# Patient Record
Sex: Male | Born: 1995 | Race: Black or African American | Hispanic: No | Marital: Single | Smoking: Current every day smoker
Health system: Southern US, Community
[De-identification: ages and names within clinical notes are randomized; demographics above are authoritative.]

---
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
## 2017-05-11 ENCOUNTER — Ambulatory Visit (HOSPITAL_COMMUNITY)
Admission: EM | Admit: 2017-05-11 | Discharge: 2017-05-11 | Disposition: A | Discharge status: Home / Self Care | Payer: Self-pay | Attending: Family Medicine | Admitting: Family Medicine

## 2017-05-11 ENCOUNTER — Encounter (HOSPITAL_COMMUNITY): Payer: Self-pay | Admitting: *Deleted

## 2017-05-11 ENCOUNTER — Other Ambulatory Visit: Payer: Self-pay

## 2017-05-11 DIAGNOSIS — J069 Acute upper respiratory infection, unspecified: Secondary | ICD-10-CM

## 2017-05-11 DIAGNOSIS — B9789 Other viral agents as the cause of diseases classified elsewhere: Secondary | ICD-10-CM

## 2017-05-11 DIAGNOSIS — R110 Nausea: Secondary | ICD-10-CM

## 2017-05-11 MED ORDER — ONDANSETRON HCL 4 MG PO TABS: 4.0000 mg | ORAL_TABLET | Freq: Four times a day (QID) | ORAL | 0 refills | Status: DC

## 2017-05-11 NOTE — Discharge Instructions (Signed)
You have a viral infection, could be the flu. Treat with Delsym as needed for cough. This is over the counter. May take Motrin 800mg  (OTC) every 8 hours for fever and chills. Use the Zofran if needed for nausea. Push fluids. FU if worsens.

## 2017-05-11 NOTE — ED Provider Notes (Signed)
[facility]    CSN: [id number] Arrival date & time: 05/11/17  1600     History   Chief Complaint Chief Complaint  Patient presents with  . Cough  . Nausea    HPI Clinton Rhodes is a 22 y.o. male.   Who presents with a 2-3 day history of dry cough, fever, chills, myalgias, and nausea. No known exposures. Subjective fever is noted.       History reviewed. No pertinent past medical history.  There are no active problems to display for this patient.   History reviewed. No pertinent surgical history.     Home Medications    Prior to Admission medications   Medication Sig Start Date End Date Taking? Authorizing Provider  ondansetron (ZOFRAN) 4 MG tablet Take 1 tablet (4 mg total) by mouth every 6 (six) hours. 05/11/17   Riki SheerYoung, Lajeana Strough G, PA-C    Family History History reviewed. No pertinent family history.  Social History Social History   Tobacco Use  . Smoking status: Current Every Day Smoker  . Smokeless tobacco: Never Used  Substance Use Topics  . Alcohol use: Yes    Comment: occasionally  . Drug use: Yes    Types: Marijuana     Allergies   Shellfish allergy   Review of Systems Review of Systems  Constitutional: Positive for chills, fatigue and fever.  HENT: Negative.   Respiratory: Positive for cough. Negative for shortness of breath and wheezing.   Musculoskeletal: Positive for arthralgias.  Skin: Negative.      Physical Exam Triage Vital Signs ED Triage Vitals  Enc Vitals Group     BP 05/11/17 1615 139/66     Pulse Rate 05/11/17 1615 88     Resp 05/11/17 1615 16     Temp 05/11/17 1615 (!) 97.5 F (36.4 C)     Temp Source 05/11/17 1615 Oral     SpO2 05/11/17 1615 98 %     Weight --      Height --      Head Circumference --      Peak Flow --      Pain Score 05/11/17 1617 0     Pain Loc --      Pain Edu? --      Excl. in GC? --    No data found.  Updated Vital Signs BP 139/66   Pulse 88   Temp (!) 97.5 F  (36.4 C) (Oral)   Resp 16   SpO2 98%   Visual Acuity Right Eye Distance:   Left Eye Distance:   Bilateral Distance:    Right Eye Near:   Left Eye Near:    Bilateral Near:     Physical Exam  Constitutional: He is oriented to person, place, and time. He appears well-developed and well-nourished. No distress.  HENT:  Head: Normocephalic and atraumatic.  Right Ear: External ear normal.  Left Ear: External ear normal.  Nose: Nose normal.  Mouth/Throat: Oropharynx is clear and moist.  Neck: Normal range of motion.  Cardiovascular: Normal rate and regular rhythm.  Pulmonary/Chest: Effort normal. He has wheezes.  Mild expiratory wheeze lower bases  Abdominal: Soft. Bowel sounds are normal. He exhibits no distension. There is no tenderness. There is no guarding.  Lymphadenopathy:    He has no cervical adenopathy.  Neurological: He is alert and oriented to person, place, and time.  Skin: Skin is warm and dry. He is not diaphoretic.  Psychiatric: His behavior is normal.  Nursing note and vitals reviewed.    UC Treatments / Results  Labs (all labs ordered are listed, but only abnormal results are displayed) Labs Reviewed - No data to display  EKG  EKG Interpretation None       Radiology No results found.  Procedures Procedures (including critical care time)  Medications Ordered in UC Medications - No data to display   Initial Impression / Assessment and Plan / UC Course  I have reviewed the triage vital signs and the nursing notes.  Pertinent labs & imaging results that were available during my care of the patient were reviewed by me and considered in my medical decision making (see chart for details).     Viral illness, treat symptomatically, provide work note. FU as needed.   Final Clinical Impressions(s) / UC Diagnoses   Final diagnoses:  Viral URI with cough  Nausea    ED Discharge Orders        Ordered    ondansetron (ZOFRAN) 4 MG tablet  Every 6  hours     05/11/17 1626       Controlled Substance Prescriptions [place] Controlled Substance Registry consulted? Not Applicable   Sharin Mons 05/11/17 1630

## 2017-05-11 NOTE — ED Triage Notes (Signed)
C/O cough, SOB, nausea & vomiting (able to keep down PO fluids), chills x 2 days.  C/O tactile fever.  Has been taking Alka Seltzer.

## 2018-01-18 ENCOUNTER — Encounter (HOSPITAL_COMMUNITY): Payer: Self-pay | Admitting: Emergency Medicine

## 2018-01-18 ENCOUNTER — Emergency Department (HOSPITAL_COMMUNITY)
Admission: EM | Admit: 2018-01-18 | Discharge: 2018-01-18 | Disposition: A | Discharge status: Home / Self Care | Payer: Self-pay | Attending: Emergency Medicine | Admitting: Emergency Medicine

## 2018-01-18 ENCOUNTER — Other Ambulatory Visit: Payer: Self-pay

## 2018-01-18 DIAGNOSIS — F17210 Nicotine dependence, cigarettes, uncomplicated: Secondary | ICD-10-CM | POA: Insufficient documentation

## 2018-01-18 DIAGNOSIS — Z79899 Other long term (current) drug therapy: Secondary | ICD-10-CM | POA: Insufficient documentation

## 2018-01-18 DIAGNOSIS — R1110 Vomiting, unspecified: Secondary | ICD-10-CM | POA: Insufficient documentation

## 2018-01-18 DIAGNOSIS — R112 Nausea with vomiting, unspecified: Secondary | ICD-10-CM

## 2018-01-18 DIAGNOSIS — R197 Diarrhea, unspecified: Secondary | ICD-10-CM | POA: Insufficient documentation

## 2018-01-18 LAB — COMPREHENSIVE METABOLIC PANEL
ALT: 21 U/L (ref 0–44)
AST: 34 U/L (ref 15–41)
Albumin: 4.1 g/dL (ref 3.5–5.0)
Alkaline Phosphatase: 35 U/L — ABNORMAL LOW (ref 38–126)
Anion gap: 10 (ref 5–15)
BUN: <5 mg/dL — ABNORMAL LOW (ref 6–20)
CO2: 23 mmol/L (ref 22–32)
Calcium: 9.4 mg/dL (ref 8.9–10.3)
Chloride: 104 mmol/L (ref 98–111)
Creatinine, Ser: 0.79 mg/dL (ref 0.61–1.24)
GFR calc Af Amer: >60 mL/min (ref >60)
GFR calc non Af Amer: >60 mL/min (ref >60)
Glucose, Bld: 109 mg/dL — ABNORMAL HIGH (ref 70–99)
Potassium: 4 mmol/L (ref 3.5–5.1)
Sodium: 137 mmol/L (ref 135–145)
Total Bilirubin: 1.2 mg/dL (ref 0.3–1.2)
Total Protein: 7.7 g/dL (ref 6.5–8.1)

## 2018-01-18 LAB — CBC
HCT: 43.8 % (ref 39.0–52.0)
Hemoglobin: 13.5 g/dL (ref 13.0–17.0)
MCH: 23 pg — ABNORMAL LOW (ref 26.0–34.0)
MCHC: 30.8 g/dL (ref 30.0–36.0)
MCV: 74.6 fL — ABNORMAL LOW (ref 80.0–100.0)
Platelets: 225 10*3/uL (ref 150–400)
RBC: 5.87 MIL/uL — ABNORMAL HIGH (ref 4.22–5.81)
RDW: 15.5 % (ref 11.5–15.5)
WBC: 5.9 10*3/uL (ref 4.0–10.5)
nRBC: 0 % (ref 0.0–0.2)

## 2018-01-18 LAB — LIPASE, BLOOD: Lipase: 22 U/L (ref 11–51)

## 2018-01-18 MED ORDER — ONDANSETRON HCL 4 MG/2ML IJ SOLN
4.0000 mg | Freq: Once | INTRAMUSCULAR | Status: AC
  Administered 2018-01-18: 4 mg via INTRAVENOUS
  Filled 2018-01-18: qty 2

## 2018-01-18 MED ORDER — ONDANSETRON HCL 8 MG PO TABS: 8.0000 mg | ORAL_TABLET | Freq: Three times a day (TID) | ORAL | 0 refills | Status: AC | PRN

## 2018-01-18 MED ORDER — SODIUM CHLORIDE 0.9 % IV BOLUS
1000.0000 mL | Freq: Once | INTRAVENOUS | Status: AC
  Administered 2018-01-18: 1000 mL via INTRAVENOUS

## 2018-01-18 NOTE — ED Notes (Signed)
Patient verbalizes understanding of discharge instructions. Opportunity for questioning and answers were provided. Ambulatory at discharge in NAD.  

## 2018-01-18 NOTE — Discharge Instructions (Addendum)
Start with a clear liquid diet then gradually advance to regular foods after a day or 2.  Use Tylenol or Motrin for pain or fever.  Return here, if needed, for problems.  We sent a prescription for a nausea pill to your drugstore.

## 2018-01-18 NOTE — ED Provider Notes (Signed)
[facility] EMERGENCY DEPARTMENT Provider Note   CSN: [id number] Arrival date & time: 01/18/18  1251     History   Chief Complaint Chief Complaint  Patient presents with  . Headache  . Nausea  . Chills    HPI Clinton Rhodes is a 22 y.o. male.  HPI   Patient presents for evaluation illness for several days associated with general achiness, nausea, vomiting, and diarrhea.  He has had chills without documented fever.  He denies weakness or dizziness.  Has not been any blood in the emesis or stool.  Several coworkers have similar illnesses.  He does not take chronic medications.  There are no other known modifying factors.  History reviewed. No pertinent past medical history.  There are no active problems to display for this patient.   History reviewed. No pertinent surgical history.      Home Medications    Prior to Admission medications   Medication Sig Start Date End Date Taking? Authorizing Provider  acetaminophen (TYLENOL) 500 MG tablet Take 500 mg by mouth every 6 (six) hours as needed for headache.    Yes [provider]  ibuprofen (ADVIL,MOTRIN) 200 MG tablet Take 200 mg by mouth every 6 (six) hours as needed for headache or moderate pain.    Yes [provider]  ondansetron (ZOFRAN) 8 MG tablet Take 1 tablet (8 mg total) by mouth every 8 (eight) hours as needed for nausea or vomiting. 01/18/18   Mancel Bale, MD    Family History No family history on file.  Social History Social History   Tobacco Use  . Smoking status: Current Every Day Smoker  . Smokeless tobacco: Never Used  Substance Use Topics  . Alcohol use: Yes    Comment: occasionally  . Drug use: Yes    Types: Marijuana     Allergies   Shellfish allergy   Review of Systems Review of Systems  All other systems reviewed and are negative.    Physical Exam Updated Vital Signs BP (!) 156/87   Pulse (!) 58   Temp 100 F (37.8 C) (Oral)    Resp 17   Ht 5\' 10"  (1.778 m)   Wt 83.9 kg   SpO2 96%   BMI 26.54 kg/m   Physical Exam  Constitutional: He is oriented to person, place, and time. He appears well-developed and well-nourished.  HENT:  Head: Normocephalic and atraumatic.  Right Ear: External ear normal.  Left Ear: External ear normal.  Eyes: Pupils are equal, round, and reactive to light. Conjunctivae and EOM are normal.  Neck: Normal range of motion and phonation normal. Neck supple.  Cardiovascular: Normal rate, regular rhythm and normal heart sounds.  Pulmonary/Chest: Effort normal and breath sounds normal. No respiratory distress. He exhibits no bony tenderness.  Abdominal: Soft. There is no tenderness.  Musculoskeletal: Normal range of motion.  Neurological: He is alert and oriented to person, place, and time. No cranial nerve deficit or sensory deficit. He exhibits normal muscle tone. Coordination normal.  Skin: Skin is warm, dry and intact.  Psychiatric: He has a normal mood and affect. His behavior is normal. Judgment and thought content normal.  Nursing note and vitals reviewed.    ED Treatments / Results  Labs (all labs ordered are listed, but only abnormal results are displayed) Labs Reviewed  COMPREHENSIVE METABOLIC PANEL - Abnormal; Notable for the following components:      Result Value   Glucose, Bld 109 (*)  BUN <5 (*)    Alkaline Phosphatase 35 (*)    All other components within normal limits  CBC - Abnormal; Notable for the following components:   RBC 5.87 (*)    MCV 74.6 (*)    MCH 23.0 (*)    All other components within normal limits  LIPASE, BLOOD  URINALYSIS, ROUTINE W REFLEX MICROSCOPIC    EKG None  Radiology No results found.  Procedures Procedures (including critical care time)  Medications Ordered in ED Medications  sodium chloride 0.9 % bolus 1,000 mL (0 mLs Intravenous Stopped 01/18/18 1506)  ondansetron (ZOFRAN) injection 4 mg (4 mg Intravenous Given 01/18/18  1407)     Initial Impression / Assessment and Plan / ED Course  I have reviewed the triage vital signs and the nursing notes.  Pertinent labs & imaging results that were available during my care of the patient were reviewed by me and considered in my medical decision making (see chart for details).  Clinical Course as of Jan 19 1544  Sun Jan 18, 2018  1544 Normal except mild elevation of glucose.  Comprehensive metabolic panel(!) [EW]  1545 Normal  Lipase, blood [EW]  1545 Normal except MCV low  CBC(!) [EW]    Clinical Course User Index [EW] Mancel Bale, MD     Patient Vitals for the past 24 hrs:  BP Temp Temp src Pulse Resp SpO2 Height Weight  01/18/18 1500 (!) 156/87 - - (!) 58 - 96 % - -  01/18/18 1430 (!) 143/92 - - 76 - 97 % - -  01/18/18 1400 (!) 148/98 - - 67 - 99 % - -  01/18/18 1300 (!) 152/88 100 F (37.8 C) Oral 62 17 100 % 5\' 10"  (1.778 m) 83.9 kg    3:43 PM Reevaluation with update and discussion. After initial assessment and treatment, an updated evaluation reveals he is comfortable has no further complaints.  He has completed a liter of IV fluids.  Findings discussed and questions answered. Mancel Bale   Medical Decision Making: Nausea vomiting diarrhea likely viral, possibly food poisoning.  Patient is nontoxic.  Evaluation here is showing.  No indication for further treatment here or hospitalization at this time.  CRITICAL CARE-no Performed by: Mancel Bale  Nursing Notes Reviewed/ Care Coordinated Applicable Imaging Reviewed Interpretation of Laboratory Data incorporated into ED treatment  The patient appears reasonably screened and/or stabilized for discharge and I doubt any other medical condition or other [facility] requiring further screening, evaluation, or treatment in the ED at this time prior to discharge.  Plan: Home Medications-OTC antipyretic as needed; Home Treatments-gradually advance diet; return here if the recommended treatment, does not  improve the symptoms; Recommended follow up-PCP, PRN   Final Clinical Impressions(s) / ED Diagnoses   Final diagnoses:  Nausea vomiting and diarrhea    ED Discharge Orders         Ordered    ondansetron (ZOFRAN) 8 MG tablet  Every 8 hours PRN     01/18/18 1542           Mancel Bale, MD 01/18/18 1545

## 2018-01-18 NOTE — ED Triage Notes (Signed)
Pt. Stated, Clinton Rhodes had stomach pain on Friday and couldn't keep nothing down, sometimes I can keep some  liquids down. Ive also been shaking all over with chills. This all stated on Friday.

## 2020-03-06 ENCOUNTER — Other Ambulatory Visit: Payer: Self-pay

## 2020-03-06 ENCOUNTER — Ambulatory Visit (HOSPITAL_COMMUNITY)
Admission: EM | Admit: 2020-03-06 | Discharge: 2020-03-06 | Disposition: A | Discharge status: Home / Self Care | Payer: Self-pay

## 2020-03-06 ENCOUNTER — Encounter (HOSPITAL_COMMUNITY): Payer: Self-pay | Admitting: *Deleted

## 2020-03-06 DIAGNOSIS — R197 Diarrhea, unspecified: Secondary | ICD-10-CM

## 2020-03-06 NOTE — ED Triage Notes (Signed)
Pt presents today for a work note. Pt reports he was not seen by Provider for the stomach bug on SAT

## 2020-03-06 NOTE — ED Provider Notes (Signed)
[facility]    CSN: [id number] Arrival date & time: 03/06/20  1602      History   Chief Complaint Chief Complaint  Patient presents with  . Letter for School/Work  . Abdominal Pain  . Diarrhea    HPI Clinton Rhodes is a 24 y.o. male.   Pt complains of diarrhea and abdominal discomfort that started two days ago.  Denies fever, chills, bloody stools, nausea, vomiting.  He reports some acid reflux as well.  He has taken nothing for the sx.  Denies sick contacts.  Requests a work note for today.      History reviewed. No pertinent past medical history.  There are no problems to display for this patient.   History reviewed. No pertinent surgical history.     Home Medications    Prior to Admission medications   Medication Sig Start Date End Date Taking? Authorizing Provider  ibuprofen (ADVIL,MOTRIN) 200 MG tablet Take 200 mg by mouth every 6 (six) hours as needed for headache or moderate pain.    Yes [provider]  acetaminophen (TYLENOL) 500 MG tablet Take 500 mg by mouth every 6 (six) hours as needed for headache.     [provider]  ondansetron (ZOFRAN) 8 MG tablet Take 1 tablet (8 mg total) by mouth every 8 (eight) hours as needed for nausea or vomiting. 01/18/18   Mancel Bale, MD    Family History History reviewed. No pertinent family history.  Social History Social History   Tobacco Use  . Smoking status: Current Every Day Smoker  . Smokeless tobacco: Never Used  Vaping Use  . Vaping Use: Never used  Substance Use Topics  . Alcohol use: Yes    Comment: occasionally  . Drug use: Yes    Types: Marijuana     Allergies   Shellfish allergy   Review of Systems Review of Systems  Constitutional: Negative for chills and fever.  HENT: Negative for ear pain and sore throat.   Eyes: Negative for pain and visual disturbance.  Respiratory: Negative for cough and shortness of breath.   Cardiovascular: Negative for  chest pain and palpitations.  Gastrointestinal: Positive for abdominal pain and diarrhea. Negative for blood in stool, nausea and vomiting.  Genitourinary: Negative for dysuria and hematuria.  Musculoskeletal: Negative for arthralgias and back pain.  Skin: Negative for color change and rash.  Neurological: Negative for seizures and syncope.  All other systems reviewed and are negative.    Physical Exam Triage Vital Signs ED Triage Vitals  Enc Vitals Group     BP 03/06/20 1717 140/88     Pulse Rate 03/06/20 1717 (!) 59     Resp 03/06/20 1717 16     Temp 03/06/20 1717 98 F (36.7 C)     Temp Source 03/06/20 1717 Oral     SpO2 03/06/20 1717 100 %     Weight --      Height 03/06/20 1720 5\' 9"  (1.753 m)     Head Circumference --      Peak Flow --      Pain Score 03/06/20 1719 4     Pain Loc --      Pain Edu? --      Excl. in GC? --    No data found.  Updated Vital Signs BP 140/88 (BP Location: Right Arm)   Pulse (!) 59   Temp 98 F (36.7 C) (Oral)   Resp 16   Ht 5\' 9"  (  1.753 m)   SpO2 100%   BMI 27.32 kg/m   Visual Acuity Right Eye Distance:   Left Eye Distance:   Bilateral Distance:    Right Eye Near:   Left Eye Near:    Bilateral Near:     Physical Exam Vitals and nursing note reviewed.  Constitutional:      Appearance: He is well-developed.  HENT:     Head: Normocephalic and atraumatic.  Eyes:     Conjunctiva/sclera: Conjunctivae normal.  Cardiovascular:     Rate and Rhythm: Normal rate and regular rhythm.     Heart sounds: No murmur heard.   Pulmonary:     Effort: Pulmonary effort is normal. No respiratory distress.     Breath sounds: Normal breath sounds.  Abdominal:     General: Bowel sounds are normal.     Palpations: Abdomen is soft.     Tenderness: There is no abdominal tenderness. There is no guarding or rebound. Negative signs include McBurney's sign.  Musculoskeletal:     Cervical back: Neck supple.  Skin:    General: Skin is warm and  dry.  Neurological:     Mental Status: He is alert.      UC Treatments / Results  Labs (all labs ordered are listed, but only abnormal results are displayed) Labs Reviewed - No data to display  EKG   Radiology No results found.  Procedures Procedures (including critical care time)  Medications Ordered in UC Medications - No data to display  Initial Impression / Assessment and Plan / UC Course  I have reviewed the triage vital signs and the nursing notes.  Pertinent labs & imaging results that were available during my care of the patient were reviewed by me and considered in my medical decision making (see chart for details).     Vitals WNL, benign abdominal exam.  Recommend imodium and high fiber diet.  Return precautions discussed. Work note given for today.  Final Clinical Impressions(s) / UC Diagnoses   Final diagnoses:  None   Discharge Instructions   None    ED Prescriptions    None     PDMP not reviewed this encounter.   Jodell Cipro, PA-C 03/06/20 1733

## 2020-03-06 NOTE — ED Triage Notes (Signed)
Pt reported he has had diarrhea all day today . ABD pain started on sat.

## 2020-03-06 NOTE — Discharge Instructions (Signed)
Recommend over the counter Imodium May take Prilosec for acid reflux.

## 2020-03-10 ENCOUNTER — Ambulatory Visit (HOSPITAL_COMMUNITY)
Admission: EM | Admit: 2020-03-10 | Discharge: 2020-03-10 | Disposition: A | Discharge status: Home / Self Care | Payer: Self-pay | Attending: Emergency Medicine | Admitting: Emergency Medicine

## 2020-03-10 ENCOUNTER — Other Ambulatory Visit: Payer: Self-pay

## 2020-03-10 ENCOUNTER — Encounter (HOSPITAL_COMMUNITY): Payer: Self-pay | Admitting: Emergency Medicine

## 2020-03-10 DIAGNOSIS — Z7689 Persons encountering health services in other specified circumstances: Secondary | ICD-10-CM

## 2020-03-10 DIAGNOSIS — R109 Unspecified abdominal pain: Secondary | ICD-10-CM

## 2020-03-10 MED ORDER — DICYCLOMINE HCL 20 MG PO TABS: 20.0000 mg | ORAL_TABLET | Freq: Two times a day (BID) | ORAL | 0 refills | Status: AC

## 2020-03-10 NOTE — Discharge Instructions (Signed)
Take Bentyl as needed for abdominal spasms

## 2020-03-10 NOTE — ED Triage Notes (Signed)
Pt presents with abdominal pain that comes and goes. States is no longer having diarrhea but is having constipation. States gets bad cramps when eating solid foods.

## 2020-03-10 NOTE — ED Provider Notes (Signed)
____________________________________________  Time seen: Approximately 4:01 PM  I have reviewed the triage vital signs and the nursing notes.   HISTORY  Chief Complaint Abdominal Pain   Historian Patient    HPI Clinton Rhodes is a 24 y.o. male presents to the urgent care for a return to work evaluation.  Patient stated that he had some diarrhea earlier in the week which is since resolved.  Patient states that he still has some discomfort when he eats solid foods.  He denies emesis or current nausea.  No chest pain or chest tightness.  Patient is requesting a work note.   History reviewed. No pertinent past medical history.   Immunizations up to date:  Yes.     History reviewed. No pertinent past medical history.  There are no problems to display for this patient.   History reviewed. No pertinent surgical history.  Prior to Admission medications   Medication Sig Start Date End Date Taking? Authorizing Provider  acetaminophen (TYLENOL) 500 MG tablet Take 500 mg by mouth every 6 (six) hours as needed for headache.     [provider]  dicyclomine (BENTYL) 20 MG tablet Take 1 tablet (20 mg total) by mouth 2 (two) times daily. 03/10/20   Orvil Feil, PA-C  ibuprofen (ADVIL,MOTRIN) 200 MG tablet Take 200 mg by mouth every 6 (six) hours as needed for headache or moderate pain.     [provider]  ondansetron (ZOFRAN) 8 MG tablet Take 1 tablet (8 mg total) by mouth every 8 (eight) hours as needed for nausea or vomiting. 01/18/18   Mancel Bale, MD    Allergies Shellfish allergy  History reviewed. No pertinent family history.  Social History Social History   Tobacco Use  . Smoking status: Current Every Day Smoker  . Smokeless tobacco: Never Used  Vaping Use  . Vaping Use: Never used  Substance Use Topics  . Alcohol use: Yes    Comment: occasionally  . Drug use: Yes    Types: Marijuana     Review of Systems  Constitutional: No  fever/chills Eyes:  No discharge ENT: No upper respiratory complaints. Respiratory: no cough. No SOB/ use of accessory muscles to breath Gastrointestinal: Patient has abdominal spasms. Musculoskeletal: Negative for musculoskeletal pain. Skin: Negative for rash, abrasions, lacerations, ecchymosis.    ____________________________________________   PHYSICAL EXAM:  VITAL SIGNS: ED Triage Vitals  Enc Vitals Group     BP 03/10/20 1523 137/88     Pulse Rate 03/10/20 1523 62     Resp 03/10/20 1523 16     Temp 03/10/20 1523 98.3 F (36.8 C)     Temp Source 03/10/20 1523 Oral     SpO2 03/10/20 1523 96 %     Weight --      Height --      Head Circumference --      Peak Flow --      Pain Score 03/10/20 1522 6     Pain Loc --      Pain Edu? --      Excl. in GC? --      Constitutional: Alert and oriented. Well appearing and in no acute distress. Eyes: Conjunctivae are normal. PERRL. EOMI. Head: Atraumatic. Cardiovascular: Normal rate, regular rhythm. Normal S1 and S2.  Good peripheral circulation. Respiratory: Normal respiratory effort without tachypnea or retractions. Lungs CTAB. Good air entry to the bases with no decreased or absent breath sounds Gastrointestinal: Bowel sounds x 4 quadrants. Soft and nontender to  palpation. No guarding or rigidity. No distention. Musculoskeletal: Full range of motion to all extremities. No obvious deformities noted Neurologic:  Normal for age. No gross focal neurologic deficits are appreciated.  Skin:  Skin is warm, dry and intact. No rash noted. Psychiatric: Mood and affect are normal for age. Speech and behavior are normal.   ____________________________________________   LABS (all labs ordered are listed, but only abnormal results are displayed)  Labs Reviewed - No data to display ____________________________________________  EKG   ____________________________________________  RADIOLOGY   No results  found.  ____________________________________________    PROCEDURES  Procedure(s) performed:     Procedures     Medications - No data to display   ____________________________________________   INITIAL IMPRESSION / ASSESSMENT AND PLAN / ED COURSE  Pertinent labs & imaging results that were available during my care of the patient were reviewed by me and considered in my medical decision making (see chart for details).      Assessment and plan Abdominal spasms Return to work evaluation 24 year old male presents to the urgent care for return to work evaluation and expresses concern for some persistent abdominal discomfort.  Vital signs are reassuring at triage.  On physical exam, patient was alert and active and seemed nontoxic-appearing.  Abdomen was soft and nontender without guarding.  We will treat patient with Bentyl to be used as needed for abdominal spasms.  A work note was given at his request.    ____________________________________________  FINAL CLINICAL IMPRESSION(S) / ED DIAGNOSES  Final diagnoses:  Return to work evaluation  Abdominal spasms      NEW MEDICATIONS STARTED DURING THIS VISIT:  ED Discharge Orders         Ordered    dicyclomine (BENTYL) 20 MG tablet  2 times daily        03/10/20 1609              This chart was dictated using voice recognition software/Dragon. Despite best efforts to proofread, errors can occur which can change the meaning. Any change was purely unintentional.     Orvil Feil, PA-C 03/10/20 1616

## 2022-01-11 ENCOUNTER — Emergency Department (HOSPITAL_COMMUNITY)
Admission: EM | Admit: 2022-01-11 | Discharge: 2022-01-11 | Disposition: A | Discharge status: Home / Self Care | Payer: Self-pay | Attending: Emergency Medicine | Admitting: Emergency Medicine

## 2022-01-11 ENCOUNTER — Other Ambulatory Visit: Payer: Self-pay

## 2022-01-11 ENCOUNTER — Encounter (HOSPITAL_COMMUNITY): Payer: Self-pay

## 2022-01-11 DIAGNOSIS — K0889 Other specified disorders of teeth and supporting structures: Secondary | ICD-10-CM | POA: Insufficient documentation

## 2022-01-11 MED ORDER — PENICILLIN V POTASSIUM 500 MG PO TABS: ORAL_TABLET | ORAL | 0 refills | Status: AC

## 2022-01-11 MED ORDER — IBUPROFEN 800 MG PO TABS: 800.0000 mg | ORAL_TABLET | Freq: Three times a day (TID) | ORAL | 0 refills | Status: AC | PRN

## 2022-01-11 NOTE — Discharge Instructions (Addendum)
Follow-up with a dentist next week.  You have been referred to a dentist that you can call to make an appointment or you can see anybody you prefer to

## 2022-01-11 NOTE — ED Provider Notes (Signed)
[facility] DEPT Provider Note   CSN: [id number] Arrival date & time: 01/11/22  2409     History  Chief Complaint  Patient presents with   Dental Pain    Clinton Rhodes is a 26 y.o. male.  Patient with a history of poor dentition.   he presents today with swelling to his right side of his face.  The history is provided by the patient and medical records. No language interpreter was used.  Dental Pain Location:  Upper Quality:  Aching Severity:  Mild Onset quality:  Sudden Timing:  Constant Chronicity:  New Context: abscess   Relieved by:  Nothing Worsened by:  Nothing Associated symptoms: no fever        Home Medications Prior to Admission medications   Medication Sig Start Date End Date Taking? Authorizing Provider  ibuprofen (ADVIL) 800 MG tablet Take 1 tablet (800 mg total) by mouth every 8 (eight) hours as needed for moderate pain. 01/11/22  Yes Milton Ferguson, MD  penicillin v potassium (VEETID) 500 MG tablet Take one 4 times a day 01/11/22  Yes Milton Ferguson, MD  acetaminophen (TYLENOL) 500 MG tablet Take 500 mg by mouth every 6 (six) hours as needed for headache.     [provider]  dicyclomine (BENTYL) 20 MG tablet Take 1 tablet (20 mg total) by mouth 2 (two) times daily. 03/10/20   Lannie Fields, PA-C  ondansetron (ZOFRAN) 8 MG tablet Take 1 tablet (8 mg total) by mouth every 8 (eight) hours as needed for nausea or vomiting. 01/18/18   Daleen Bo, MD      Allergies    Shellfish allergy    Review of Systems   Review of Systems  Constitutional:  Negative for chills and fever.  HENT:  Negative for ear pain and sore throat.        Facial swelling and tender teeth  Eyes:  Negative for pain and visual disturbance.  Respiratory:  Negative for cough and shortness of breath.   Cardiovascular:  Negative for chest pain and palpitations.  Gastrointestinal:  Negative for abdominal pain and vomiting.  Genitourinary:   Negative for dysuria and hematuria.  Musculoskeletal:  Negative for arthralgias and back pain.  Skin:  Negative for color change and rash.  Neurological:  Negative for seizures and syncope.  All other systems reviewed and are negative.   Physical Exam Updated Vital Signs BP (!) 155/95 (BP Location: Left Arm)   Pulse 78   Temp 99.1 F (37.3 C) (Oral)   Resp 18   Ht 5\' 10"  (1.778 m)   Wt 83.9 kg   SpO2 100%   BMI 26.54 kg/m  Physical Exam Vitals and nursing note reviewed.  Constitutional:      Appearance: He is well-developed.  HENT:     Head: Normocephalic.     Comments: Swelling to right cheek with tender right upper molar    Nose: Nose normal.  Eyes:     General: No scleral icterus.    Conjunctiva/sclera: Conjunctivae normal.  Neck:     Thyroid: No thyromegaly.  Cardiovascular:     Rate and Rhythm: Normal rate and regular rhythm.     Heart sounds: No murmur heard.    No friction rub. No gallop.  Pulmonary:     Breath sounds: No stridor. No wheezing or rales.  Chest:     Chest wall: No tenderness.  Abdominal:     General: There is no distension.  Tenderness: There is no abdominal tenderness. There is no rebound.  Musculoskeletal:        General: Normal range of motion.     Cervical back: Neck supple.  Lymphadenopathy:     Cervical: No cervical adenopathy.  Skin:    Findings: No erythema or rash.  Neurological:     Mental Status: He is alert and oriented to person, place, and time.     Motor: No abnormal muscle tone.     Coordination: Coordination normal.  Psychiatric:        Behavior: Behavior normal.     ED Results / Procedures / Treatments   Labs (all labs ordered are listed, but only abnormal results are displayed) Labs Reviewed - No data to display  EKG None  Radiology No results found.  Procedures Procedures    Medications Ordered in ED Medications - No data to display  ED Course/ Medical Decision Making/ A&P                            Medical Decision Making Risk Prescription drug management.  This patient presents to the ED for concern of facial swelling, this involves an extensive number of treatment options, and is a complaint that carries with it a high risk of complications and morbidity.  The differential diagnosis includes abscessed tooth, tumor   Co morbidities that complicate the patient evaluation  Poor dentition   Additional history obtained:  Additional history obtained from patient External records from outside source obtained and reviewed including hospital record   Lab Tests:  No labs  Imaging Studies ordered:  No imaging Cardiac Monitoring: / EKG:  The patient was maintained on a cardiac monitor.  I personally viewed and interpreted the cardiac monitored which showed an underlying rhythm of: Normal sinus rhythm   Consultations Obtained: No consultant Problem List / ED Course / Critical interventions / Medication management  Abscessed tooth No medicines Reevaluation of the patient after these medicines showed that the patient stayed the same I have reviewed the patients home medicines and have made adjustments as needed   Social Determinants of Health:  None   Test / Admission - Considered:  No need for admission  Patient with a dental abscess.  He is placed on penicillin and Motrin and will follow-up with a dentist next week        Final Clinical Impression(s) / ED Diagnoses Final diagnoses:  Pain, dental    Rx / DC Orders ED Discharge Orders          Ordered    penicillin v potassium (VEETID) 500 MG tablet        01/11/22 1041    ibuprofen (ADVIL) 800 MG tablet  Every 8 hours PRN        01/11/22 1041              Milton Ferguson, MD 01/14/22 1015

## 2022-01-11 NOTE — ED Triage Notes (Signed)
Patient c/o right upper dental pain since yesterday. Patient has slight right facial swelling.
